# Patient Record
Sex: Male | Born: 2000 | Race: White | Hispanic: No | Marital: Single | Smoking: Never smoker
Health system: Southern US, Community
[De-identification: ages and names within clinical notes are randomized; demographics above are authoritative.]

## PROBLEM LIST (undated history)

## (undated) HISTORY — PX: TIBIAL TUBERCLERPLASTY: SHX5953

---
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
## 2020-07-27 ENCOUNTER — Other Ambulatory Visit: Payer: Self-pay

## 2020-07-27 DIAGNOSIS — Z1152 Encounter for screening for COVID-19: Secondary | ICD-10-CM

## 2020-07-27 LAB — POCT INFLUENZA A/B
Influenza A, POC: NEGATIVE
Influenza B, POC: NEGATIVE

## 2020-07-27 NOTE — Progress Notes (Signed)
Pt symptomatic and requested flu and covid test.

## 2020-07-29 LAB — SARS-COV-2, NAA 2 DAY TAT

## 2020-07-29 LAB — NOVEL CORONAVIRUS, NAA: SARS-CoV-2, NAA: NOT DETECTED

## 2020-09-16 ENCOUNTER — Other Ambulatory Visit: Payer: Self-pay

## 2020-09-16 NOTE — Progress Notes (Signed)
Presents to [facility] for Rapid Covid test.  + Exposure on 09/11/20  Asymptomatic  Vaccinated - 2nd dose 10/2019 No Booster  Rapid Covid Test Results = Positive  Isolation till 09/22/20 then mask x5 days  AMD

## 2021-02-24 ENCOUNTER — Other Ambulatory Visit: Payer: Self-pay

## 2021-02-24 ENCOUNTER — Ambulatory Visit: Payer: Self-pay | Admitting: Physician Assistant

## 2021-02-24 ENCOUNTER — Encounter: Payer: Self-pay | Admitting: Physician Assistant

## 2021-02-24 DIAGNOSIS — L740 Miliaria rubra: Secondary | ICD-10-CM

## 2021-02-24 MED ORDER — METHYLPREDNISOLONE 4 MG PO TBPK: ORAL_TABLET | ORAL | 0 refills | Status: AC

## 2021-02-24 MED ORDER — HYDROXYZINE PAMOATE 25 MG PO CAPS: 25.0000 mg | ORAL_CAPSULE | Freq: Three times a day (TID) | ORAL | 0 refills | Status: AC | PRN

## 2021-02-24 MED ORDER — DIPHENHYDRAMINE-ZINC ACETATE 2-0.1 % EX CREA: 1.0000 "application " | TOPICAL_CREAM | Freq: Three times a day (TID) | CUTANEOUS | 0 refills | Status: AC | PRN

## 2021-02-24 NOTE — Progress Notes (Signed)
   Subjective: Heat rash    Patient ID: Todd Bryan, male    DOB: [birth year], 20 y.o.   MRN: [id number]  HPI Patient presents with acute onset of rash to the posterior neck.  Patient works for the [employer].  Patient states intense itching and burning. Rash is confined to the upper back and posterior neck.  Review of Systems     Objective:   Physical Exam No acute distress.  Papulovesicular lesions on erythematous base posterior neck and upper back.       Assessment & Plan: Heat rash   Patient given discharge care instruction advised to avoid shirts with high collar for the next 3 days.  Limit exposure to direct sunlight.  Take medication as directed.  Follow-up in 3 to 4 days if no improvement or worsening complaint.
